# Patient Record
Sex: Male | Born: 1995 | Race: Black or African American | Hispanic: No | Marital: Single | Smoking: Never smoker
Health system: Southern US, Community
[De-identification: ages and names within clinical notes are randomized; demographics above are authoritative.]

---

## 2014-04-27 ENCOUNTER — Emergency Department (INDEPENDENT_AMBULATORY_CARE_PROVIDER_SITE_OTHER)
Admission: EM | Admit: 2014-04-27 | Discharge: 2014-04-27 | Disposition: A | Discharge status: Home / Self Care | Payer: 59 | Source: Home / Self Care | Attending: Emergency Medicine | Admitting: Emergency Medicine

## 2014-04-27 ENCOUNTER — Emergency Department (INDEPENDENT_AMBULATORY_CARE_PROVIDER_SITE_OTHER): Payer: 59

## 2014-04-27 ENCOUNTER — Encounter (HOSPITAL_COMMUNITY): Payer: Self-pay | Admitting: Emergency Medicine

## 2014-04-27 DIAGNOSIS — S99929A Unspecified injury of unspecified foot, initial encounter: Secondary | ICD-10-CM

## 2014-04-27 DIAGNOSIS — S93529A Sprain of metatarsophalangeal joint of unspecified toe(s), initial encounter: Secondary | ICD-10-CM

## 2014-04-27 MED ORDER — TRAMADOL HCL 50 MG PO TABS: 100.0000 mg | ORAL_TABLET | Freq: Three times a day (TID) | ORAL | Status: AC | PRN

## 2014-04-27 MED ORDER — NAPROXEN 500 MG PO TABS: 500.0000 mg | ORAL_TABLET | Freq: Two times a day (BID) | ORAL | Status: AC

## 2014-04-27 NOTE — ED Notes (Signed)
States he stubbed his right great toe yesterday and fell. Pain w any motion of toe, abrasions to bilateral palms

## 2014-04-27 NOTE — Discharge Instructions (Signed)
Buddy Taping of Toes We have taped your toes together to keep them from moving. This is called "buddy taping" since we used a part of your own body to keep the injured part still. We placed soft padding between your toes to keep them from rubbing against each other. Buddy taping will help with healing and to reduce pain. Keep your toes buddy taped together for as long as directed by your caregiver. HOME CARE INSTRUCTIONS   Raise your injured area above the level of your heart while sitting or lying down. Prop it up with pillows.  An ice pack used every twenty minutes, while awake, for the first one to two days may be helpful. Put ice in a plastic bag and put a towel between the bag and your skin.  Watch for signs that the taping is too tight. These signs may be:  Numbness of your taped toes.  Coolness of your taped toes.  Color change in the area beyond the tape.  Increased pain.  If you have any of these signs, loosen or rewrap the tape. If you need to loosen or rewrap the buddy tape, make sure you use the padding again. SEEK IMMEDIATE MEDICAL CARE IF:   You have worse pain, swelling, inflammation (soreness), drainage or bleeding after you rewrap the tape.  Any new problems occur. MAKE SURE YOU:   Understand these instructions.  Will watch your condition.  Will get help right away if you are not doing well or get worse. Document Released: 01/04/2004 Document Revised: 06/24/2011 Document Reviewed: 03/29/2008 [facility] Patient Information 2015 [place], [place]. This information is not intended to replace advice given to you by your health care provider. Make sure you discuss any questions you have with your health care provider.  Turf Toe Turf toe is a condition of pain at the base of the big toe, located at the ball of the foot. The condition is usually caused from either jamming or extending the toe beyond normal limits (hyperextension). This is the result of pushing off repeatedly  when running or jumping. The main problem is pain at the base of the toe, but there may also be stiffness and swelling. The name turf toe comes from the fact that this injury is especially common among athletes who play on hard surfaces, such as artificial turf and basketball courts. Hard surfaces combined with running and jumping makes this a common sports injury. DIAGNOSIS  The diagnosis of turftoeisnotdifficult. It is made by examination. X-rays may be taken to make sure there is nobreak in the bone (fracture). Not doing surgery (conservative treatment) solves the problem most of the time. Conservative treatment includes the following home care instructions. HOME CARE INSTRUCTIONS   Apply ice to the sore area for 15-20 minutes, 03-04 times per day while awake, for the first 4 days. Put the ice in a plastic bag and place a towel between the bag of ice and your skin. Use ice if possible following any activities, even after the first four days.  Keep your leg elevated when possible to lessen swelling and discomfort in the toe.  Use crutches with non-weight bearing on the affected foot for ten days, or as needed for pain. Then you may walk as the pain allows, or as instructed. Start gradually with weight bearing on the affected foot. Shoes with stiff soles will generally be helpful in limiting pain for the first 1 to 2 weeks.  Continue to use crutches or a cane until you can stand  on your foot without causing pain.  Only take over-the-counter or prescription medicines for pain, discomfort, or fever as directed by your caregiver. SEEK IMMEDIATE MEDICAL CARE IF:   You have an increase in bruising, swelling, or pain in your toe.  Pain relief is not obtained with medications. Turf toe can return, and problems may be slow to improve. This is more common if you return to athletic activities too soon and do not allow the problem to fully recover. Surgery is rarely needed, but in certain cases it may be  necessary. If a bone spur forms and severely limits motion of the toe joint, surgery to remove the spur and improve motion of the big toe may be helpful. Document Released: 09/21/2001 Document Revised: 06/24/2011 Document Reviewed: 09/06/2008 [facility] Patient Information 2015 [place]. This information is not intended to replace advice given to you by your health care provider. Make sure you discuss any questions you have with your health care provider.

## 2014-04-27 NOTE — ED Provider Notes (Signed)
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 It's not swollen. It hurts to move the joint. It also hurts to walk. He is able ambulate and bear weight. He has a history of right toe in the past. This was treated conservatively.  Review of Systems   Other than as noted above, the patient denies any of the following symptoms: Systemic:  No fevers or chills. Musculoskeletal:  No joint pain or arthritis.  Neurological:  No muscular weakness, paresthesias.   PMFSH   Past medical history, family history, social history, meds, and allergies were reviewed.     Physical  Examination     Vital signs:  BP 136/84 mmHg  Pulse 78  Temp(Src) 98 F (36.7 C) (Oral)  Resp 16  SpO2 98% Gen:  Alert and oriented times 3.  In no distress. Musculoskeletal:  Exam of the foot reveals no swelling, bruising, or deformity of the right great toe. There is pain to palpation over the MTP joint area and he has pain on movement.  Otherwise, all joints had a full a ROM with no swelling, bruising or deformity.  No edema, pulses full. Extremities were warm and pink.  Capillary refill was brisk.  Skin:  Clear, warm and dry.  No rash. Neuro:  Alert and oriented times 3.  Muscle strength was normal.  Sensation was intact to light touch.   Radiology   Dg Toe Great Right  04/27/2014   CLINICAL DATA:  Jammed right big toe on piece of concrete  EXAM: RIGHT GREAT TOE  COMPARISON:  None.  FINDINGS: There is no evidence of fracture or dislocation. There is no evidence of arthropathy or other focal bone abnormality. Soft tissues are unremarkable.  IMPRESSION: Negative.   Electronically Signed   By: Signa Kell M.D.   On: 04/27/2014 13:48    I reviewed the images independently and personally and concur with the radiologist's  findings.   Course in Urgent Care Center   The toe was buddy taped and he was placed in a postoperative boot.  Assessment   The primary encounter diagnosis was Turf toe, initial encounter. A diagnosis of Toe injury was also pertinent to this visit.  Suggested he wear the postoperative boot and buddy tape for 2 weeks. If no improvement at the end of that time, follow-up at sports medicine.  Plan    1.  Meds:  The following meds were prescribed:   Discharge Medication List as of 04/27/2014  2:20 PM    START taking these medications   Details  naproxen (NAPROSYN) 500 MG tablet Take 1 tablet (500 mg total) by mouth 2 (two) times daily., Starting 04/27/2014, Until Discontinued, Normal    traMADol (ULTRAM) 50 MG tablet Take 2 tablets (100 mg total) by mouth every 8 (eight) hours as needed., Starting 04/27/2014, Until Discontinued, Print        2.  Patient Education/Counseling:  The patient was given appropriate handouts, self care instructions, and instructed in symptomatic relief including rest and activity, elevation, application of ice and compression.    3.  Follow up:  The patient was told to follow up here if no better in 3 to 4 days, or sooner if becoming worse in any way, and given some red flag symptoms such as worsening  pain or neurological symptoms which would prompt immediate return.       Reuben Likesavid C Marina Desire, MD 04/27/14 [phone]

## 2016-01-02 IMAGING — CR DG TOE GREAT 2+V*R*
2 series · 2 of 2 positions shown · non-contrast
Comparison: None.

CLINICAL DATA: Jammed right big toe on piece of concrete

EXAM:
RIGHT GREAT TOE

[toe lat]
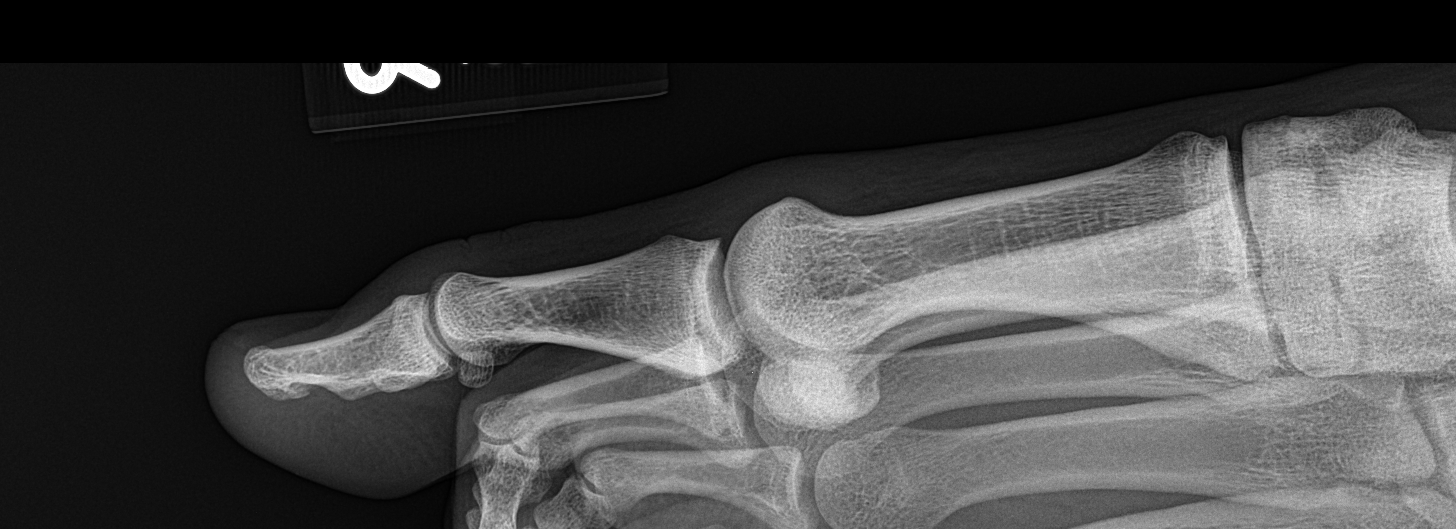

[toe obl]
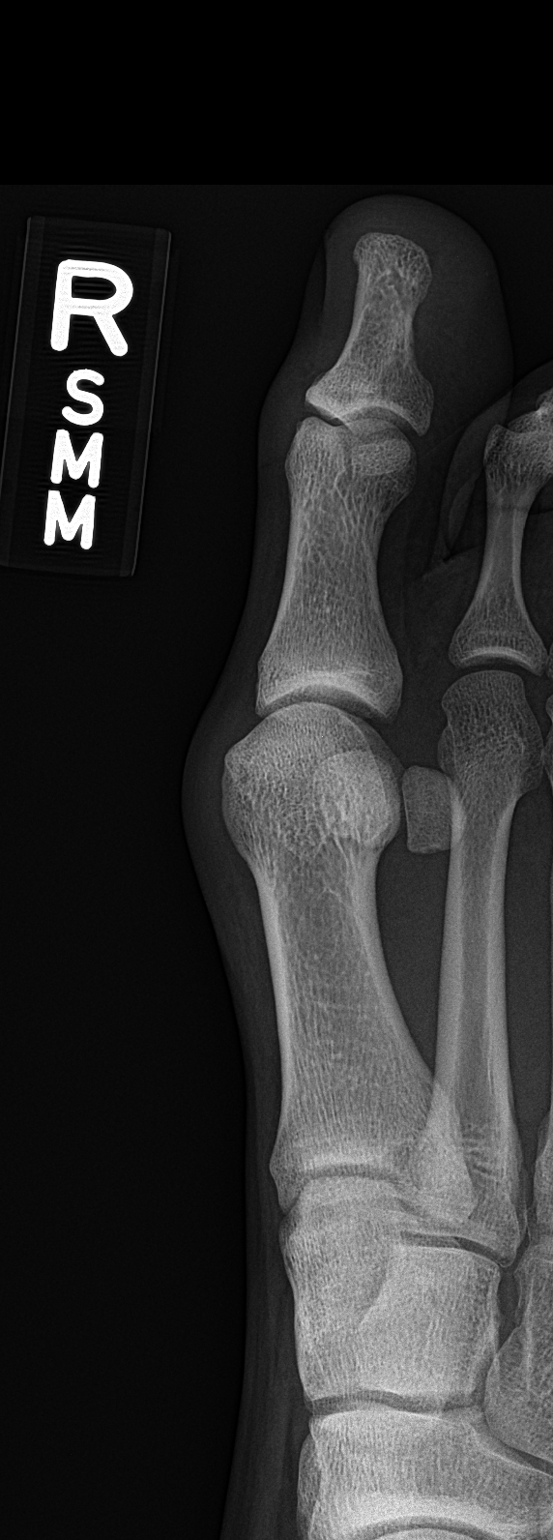

[2 of 2 positions shown; findings below may reference images not displayed]

FINDINGS: There is no evidence of fracture or dislocation. There is no
evidence of arthropathy or other focal bone abnormality. Soft
tissues are unremarkable.
IMPRESSION: Negative.
